# Patient Record
Sex: Male | Born: 1975 | Race: Black or African American | Hispanic: No | Marital: Married | Smoking: Never smoker
Health system: Southern US, Community
[De-identification: ages and names within clinical notes are randomized; demographics above are authoritative.]

## PROBLEM LIST (undated history)

## (undated) HISTORY — PX: VASECTOMY: SHX75

---

## 2020-05-17 ENCOUNTER — Encounter (HOSPITAL_BASED_OUTPATIENT_CLINIC_OR_DEPARTMENT_OTHER): Payer: Self-pay | Admitting: Emergency Medicine

## 2020-05-17 ENCOUNTER — Other Ambulatory Visit: Payer: Self-pay

## 2020-05-17 ENCOUNTER — Emergency Department (HOSPITAL_BASED_OUTPATIENT_CLINIC_OR_DEPARTMENT_OTHER): Payer: Managed Care, Other (non HMO)

## 2020-05-17 ENCOUNTER — Emergency Department (HOSPITAL_BASED_OUTPATIENT_CLINIC_OR_DEPARTMENT_OTHER)
Admission: EM | Admit: 2020-05-17 | Discharge: 2020-05-17 | Disposition: A | Discharge status: Home / Self Care | Payer: Managed Care, Other (non HMO) | Attending: Emergency Medicine | Admitting: Emergency Medicine

## 2020-05-17 DIAGNOSIS — R030 Elevated blood-pressure reading, without diagnosis of hypertension: Secondary | ICD-10-CM | POA: Diagnosis not present

## 2020-05-17 DIAGNOSIS — H53149 Visual discomfort, unspecified: Secondary | ICD-10-CM | POA: Insufficient documentation

## 2020-05-17 DIAGNOSIS — R519 Headache, unspecified: Secondary | ICD-10-CM | POA: Diagnosis present

## 2020-05-17 DIAGNOSIS — R202 Paresthesia of skin: Secondary | ICD-10-CM

## 2020-05-17 LAB — CBC WITH DIFFERENTIAL/PLATELET
Abs Immature Granulocytes: 0.01 10*3/uL (ref 0.00–0.07)
Basophils Absolute: 0 10*3/uL (ref 0.0–0.1)
Basophils Relative: 1 %
Eosinophils Absolute: 0.1 10*3/uL (ref 0.0–0.5)
Eosinophils Relative: 2 %
HCT: 45.9 % (ref 39.0–52.0)
Hemoglobin: 15.9 g/dL (ref 13.0–17.0)
Immature Granulocytes: 0 %
Lymphocytes Relative: 38 %
Lymphs Abs: 1.5 10*3/uL (ref 0.7–4.0)
MCH: 32.2 pg (ref 26.0–34.0)
MCHC: 34.6 g/dL (ref 30.0–36.0)
MCV: 92.9 fL (ref 80.0–100.0)
Monocytes Absolute: 0.4 10*3/uL (ref 0.1–1.0)
Monocytes Relative: 10 %
Neutro Abs: 2 10*3/uL (ref 1.7–7.7)
Neutrophils Relative %: 49 %
Platelets: 216 10*3/uL (ref 150–400)
RBC: 4.94 MIL/uL (ref 4.22–5.81)
RDW: 11.9 % (ref 11.5–15.5)
WBC: 4 10*3/uL (ref 4.0–10.5)
nRBC: 0 % (ref 0.0–0.2)

## 2020-05-17 LAB — COMPREHENSIVE METABOLIC PANEL
ALT: 23 U/L (ref 0–44)
AST: 27 U/L (ref 15–41)
Albumin: 4.3 g/dL (ref 3.5–5.0)
Alkaline Phosphatase: 47 U/L (ref 38–126)
Anion gap: 9 (ref 5–15)
BUN: 13 mg/dL (ref 6–20)
CO2: 25 mmol/L (ref 22–32)
Calcium: 9.1 mg/dL (ref 8.9–10.3)
Chloride: 104 mmol/L (ref 98–111)
Creatinine, Ser: 0.98 mg/dL (ref 0.61–1.24)
GFR calc Af Amer: >60 mL/min (ref >60)
GFR calc non Af Amer: >60 mL/min (ref >60)
Glucose, Bld: 119 mg/dL — ABNORMAL HIGH (ref 70–99)
Potassium: 3.4 mmol/L — ABNORMAL LOW (ref 3.5–5.1)
Sodium: 138 mmol/L (ref 135–145)
Total Bilirubin: 0.6 mg/dL (ref 0.3–1.2)
Total Protein: 7.8 g/dL (ref 6.5–8.1)

## 2020-05-17 MED ORDER — HYDROCHLOROTHIAZIDE 25 MG PO TABS: 25.0000 mg | ORAL_TABLET | Freq: Every day | ORAL | 0 refills | Status: AC

## 2020-05-17 MED ORDER — POTASSIUM CHLORIDE CRYS ER 20 MEQ PO TBCR
40.0000 meq | EXTENDED_RELEASE_TABLET | Freq: Once | ORAL | Status: AC
  Administered 2020-05-17: 40 meq via ORAL
  Filled 2020-05-17: qty 2

## 2020-05-17 MED ORDER — HYDROCHLOROTHIAZIDE 25 MG PO TABS
25.0000 mg | ORAL_TABLET | Freq: Once | ORAL | Status: AC
  Administered 2020-05-17: 25 mg via ORAL
  Filled 2020-05-17: qty 1

## 2020-05-17 NOTE — ED Provider Notes (Signed)
[facility] EMERGENCY DEPARTMENT Provider Note   CSN: [id number] Arrival date & time: 05/17/20  [id number]     History Chief Complaint  Patient presents with  . Numbness    Mitchell IvanBrian Mahajan is a 44 y.o. male.  The history is provided by medical records and the patient. No language interpreter was used.  Hypertension This is a new problem. The current episode started more than 1 week ago. The problem occurs constantly. The problem has not changed since onset.Associated symptoms include headaches. Pertinent negatives include no chest pain, no abdominal pain and no shortness of breath. Nothing aggravates the symptoms. Nothing relieves the symptoms. He has tried nothing for the symptoms. The treatment provided no relief.       History reviewed. No pertinent past medical history.  There are no problems to display for this patient.   Past Surgical History:  Procedure Laterality Date  . VASECTOMY         History reviewed. No pertinent family history.  Social History   Tobacco Use  . Smoking status: Never Smoker  . Smokeless tobacco: Never Used  Substance Use Topics  . Alcohol use: Yes  . Drug use: Never    Home Medications Prior to Admission medications   Not on File    Allergies    Patient has no known allergies.  Review of Systems   Review of Systems  Constitutional: Negative for chills, diaphoresis, fatigue and fever.  HENT: Negative for congestion.   Eyes: Positive for photophobia. Negative for visual disturbance.  Respiratory: Negative for cough, chest tightness, shortness of breath and wheezing.   Cardiovascular: Negative for chest pain and palpitations.  Gastrointestinal: Negative for abdominal pain, constipation, diarrhea, nausea and vomiting.  Genitourinary: Negative for dysuria, flank pain and frequency.  Musculoskeletal: Negative for back pain, neck pain and neck stiffness.  Neurological: Positive for numbness (tingling in hands and feet) and  headaches. Negative for dizziness, syncope, weakness and light-headedness.  Psychiatric/Behavioral: Negative for agitation and confusion.  All other systems reviewed and are negative.   Physical Exam Updated Vital Signs BP (!) 153/109 (BP Location: Right Arm)   Pulse (!) 56   Temp 98.5 F (36.9 C) (Oral)   Resp 15   Wt 83.9 kg   SpO2 100%   Physical Exam Vitals and nursing note reviewed.  Constitutional:      General: He is not in acute distress.    Appearance: He is well-developed. He is not ill-appearing, toxic-appearing or diaphoretic.  HENT:     Head: Normocephalic and atraumatic.     Nose: Nose normal. No congestion or rhinorrhea.     Mouth/Throat:     Mouth: Mucous membranes are moist.     Pharynx: No posterior oropharyngeal erythema.  Eyes:     Extraocular Movements: Extraocular movements intact.     Conjunctiva/sclera: Conjunctivae normal.     Pupils: Pupils are equal, round, and reactive to light.  Cardiovascular:     Rate and Rhythm: Normal rate and regular rhythm.     Heart sounds: No murmur heard.   Pulmonary:     Effort: Pulmonary effort is normal. No respiratory distress.     Breath sounds: Normal breath sounds. No wheezing, rhonchi or rales.  Chest:     Chest wall: No tenderness.  Abdominal:     General: Abdomen is flat.     Palpations: Abdomen is soft.     Tenderness: There is no abdominal tenderness. There is no right CVA tenderness, left CVA  tenderness, guarding or rebound.  Musculoskeletal:        General: No tenderness.     Cervical back: Neck supple. No tenderness.     Right lower leg: No edema.     Left lower leg: No edema.  Skin:    General: Skin is warm and dry.     Capillary Refill: Capillary refill takes less than 2 seconds.     Findings: No erythema.  Neurological:     General: No focal deficit present.     Mental Status: He is alert and oriented to person, place, and time.     GCS: GCS eye subscore is 4. GCS verbal subscore is 5. GCS  motor subscore is 6.     Cranial Nerves: No cranial nerve deficit, dysarthria or facial asymmetry.     Sensory: No sensory deficit.     Motor: No weakness, tremor, abnormal muscle tone or seizure activity.     Coordination: Coordination normal. Finger-Nose-Finger Test normal.     Comments: Unremarkable neurologic exam on my exam.  No numbness, tingling, grip strength decreased, or weakness present.  Good pulses in extremities.  Psychiatric:        Mood and Affect: Mood normal.     ED Results / Procedures / Treatments   Labs (all labs ordered are listed, but only abnormal results are displayed) Labs Reviewed  COMPREHENSIVE METABOLIC PANEL - Abnormal; Notable for the following components:      Result Value   Potassium 3.4 (*)    Glucose, Bld 119 (*)    All other components within normal limits  CBC WITH DIFFERENTIAL/PLATELET    EKG EKG Interpretation  Date/Time:  Saturday May 17 2020 09:38:31 EDT Ventricular Rate:  81 PR Interval:  188 QRS Duration: 90 QT Interval:  380 QTC Calculation: 441 R Axis:   -2 Text Interpretation: Sinus rhythm with Premature atrial complexes Nonspecific T wave abnormality Abnormal ECG No previous ECGs available Confirmed by Alvira Monday ([id number]) on 05/17/2020 12:38:35 PM   Radiology CT Head Wo Contrast  Result Date: 05/17/2020 CLINICAL DATA:  Headache.  Elevated blood pressure. EXAM: CT HEAD WITHOUT CONTRAST TECHNIQUE: Contiguous axial images were obtained from the base of the skull through the vertex without intravenous contrast. COMPARISON:  None. FINDINGS: Brain: No mass lesion, hemorrhage, hydrocephalus, acute infarct, intra-axial, or extra-axial fluid collection. Vascular: No hyperdense vessel or unexpected calcification. Skull: Normal skull. Sinuses/Orbits: Normal imaged portions of the orbits and globes. Clear paranasal sinuses and mastoid air cells. Other: None. IMPRESSION: Normal head CT. Electronically Signed   By: Jeronimo Greaves M.D.    On: 05/17/2020 11:01    Procedures Procedures (including critical care time)  Medications Ordered in ED Medications  potassium chloride SA (KLOR-CON) CR tablet 40 mEq (has no administration in time range)  hydrochlorothiazide (HYDRODIURIL) tablet 25 mg (has no administration in time range)    ED Course  I have reviewed the triage vital signs and the nursing notes.  Pertinent labs & imaging results that were available during my care of the patient were reviewed by me and considered in my medical decision making (see chart for details).    MDM Rules/Calculators/A&P                          Mitchell Lee is a 44 y.o. male with no significant past medical history aside from recent vasectomy who presents with low blood pressures, some headache, and extremity tingling.  Patient reports that  he checked his blood pressure at a preoperative visit for vasectomy that he recently had.  He then checked his blood pressure again and it was still elevated in the 150s and 160s.  He reports that one was in the 170s today, he wanted to get evaluated because he was having some moderate headaches with some tingling in his extremities.  He reports the tingling and numbness sensation was on both his hands and his feet but waxes and wanes with his headache.  He denies any vision changes, nausea, or vomiting.  Denies any neck pain or neck injury.  Denies any head injury.  He reports he does have photophobia.  He denies any fevers, chills, cough, chest pain, shortness of breath, lightheadedness, fatigue, urinary symptoms, or GI symptoms.  He has no history of elevated blood pressures.  He does not take any medications.  On exam, lungs clear chest nontender.  Abdomen nontender.  No focal neurologic deficits on my initial exam.  Patient resting comfortably without any fever or tachycardia.  His oxygen saturations are normal on room air.  His blood pressure is elevated and was in the 160s on my evaluation.  Due to  headache, elevated blood pressure, and some tingling, patient had a head CT in triage that was completely normal.  Other labs showed normal kidney function and liver function.  No leukocytosis or anemia.  Very mild hypokalemia seen.  Patient reports his headache is improved to a 2 out of 10 down from a 5 or 6 out of 10 on arrival.  He reports the tingling sensation is also resolved.  Patient is concerned about his elevated blood pressure that he is now had for the last month.  He reports he bought a blood pressure cuff and it was elevated in the 170s before coming.  We discussed initiation of a blood pressure medication such as HCTZ as well as supplementing his potassium that was barely low today.  Patient agrees that he wants to start blood pressure medicine so we will start him on HCTZ and have him follow-up with his PCP this week to discuss continuation or cessation of medication.  Patient understands extremely strict return precautions.  Patient agreed with plan of care and was discharged in good condition.   Final Clinical Impression(s) / ED Diagnoses Final diagnoses:  Blood pressure elevated without history of HTN  Tingling in extremities  Acute nonintractable headache, unspecified headache type    Rx / DC Orders ED Discharge Orders         Ordered    hydrochlorothiazide (HYDRODIURIL) 25 MG tablet  Daily        05/17/20 1548          Clinical Impression: 1. Blood pressure elevated without history of HTN   2. Tingling in extremities   3. Acute nonintractable headache, unspecified headache type     Disposition: Discharge  Condition: Good  I have discussed the results, Dx and Tx plan with the pt(& family if present). He/she/they expressed understanding and agree(s) with the plan. Discharge instructions discussed at great length. Strict return precautions discussed and pt &/or family have verbalized understanding of the instructions. No further questions at time of discharge.     New Prescriptions   HYDROCHLOROTHIAZIDE (HYDRODIURIL) 25 MG TABLET    Take 1 tablet (25 mg total) by mouth daily.    Follow Up: Verlon Au, MD [address] [place] [address] [phone]     [facility]  EMERGENCY DEPARTMENT [id number] [facility] [place] [address] [phone]       Ashunti Schofield, Canary Brim, MD 05/17/20 [phone]

## 2020-05-17 NOTE — ED Notes (Signed)
Pt ambulatory to room, no s/s of distress, ambulatory with normal gait

## 2020-05-17 NOTE — ED Notes (Signed)
Pt discharged to home. Discharge instructions have been discussed with patient and/or family members. Pt verbally acknowledges understanding d/c instructions, and endorses comprehension to checkout at registration before leaving.  °

## 2020-05-17 NOTE — Discharge Instructions (Signed)
Your work-up today was overall reassuring but he did have persistent elevated blood pressures as we discussed.  As you report you had elevated blood pressures for several weeks with multiple readings and no history of hypertension, we will start you on a low-dose blood pressure medication.  Please call your primary doctor this week to discuss either continuation or cessation of the medication in the future.  Your CT head did not show any bleeding, stroke, mass, or other concerning findings and your blood work otherwise looked reassuring as well.  Please rest and stay hydrated.  If any symptoms change or worsen, please return to the nearest emergency department immediately.

## 2020-05-17 NOTE — ED Triage Notes (Signed)
Patient states that he has had High BP for the last month. 2 days ago he started to have numbness to both feet and hands as well as a headache.

## 2022-03-05 IMAGING — CT CT HEAD W/O CM
3 series · 16 of 47 positions shown, 19 images · non-contrast
Comparison: None.

CLINICAL DATA: Headache.  Elevated blood pressure.

EXAM:
CT HEAD WITHOUT CONTRAST
TECHNIQUE: Contiguous axial images were obtained from the base of the skull
through the vertex without intravenous contrast.

[Series 2: head wo · axial · 0.49mm/px · z∈[+1115,+1245]mm · 10 of 32 slices shown, 13 images]
[im 3/32  brain]
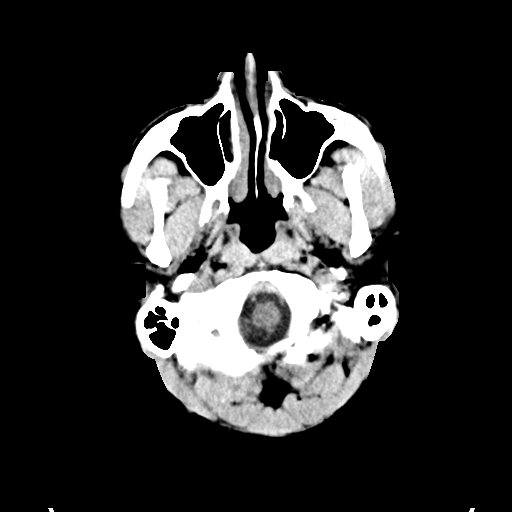
[im 3/32  bone]
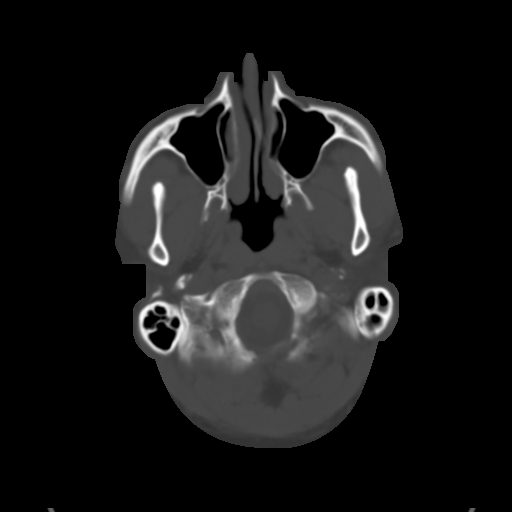
[im 6/32  brain]
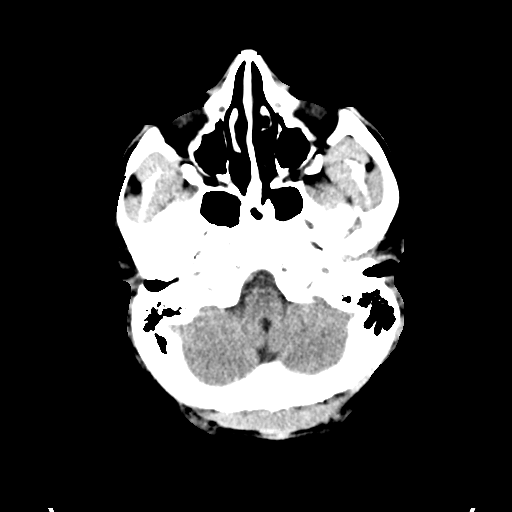
[im 9/32  brain]
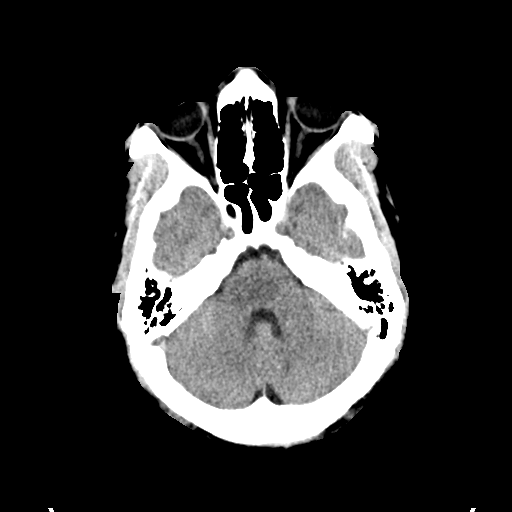
[im 11/32  brain]
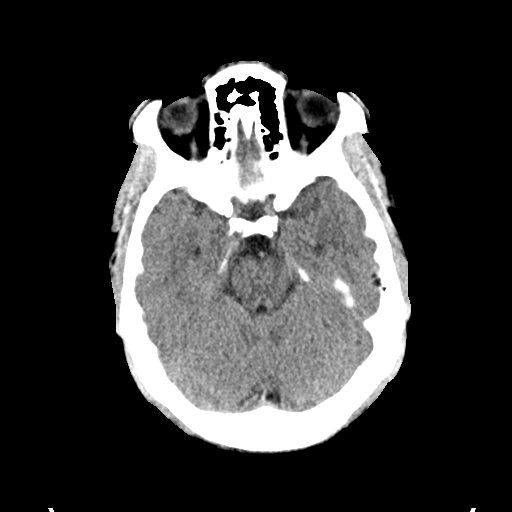
[im 14/32  brain]
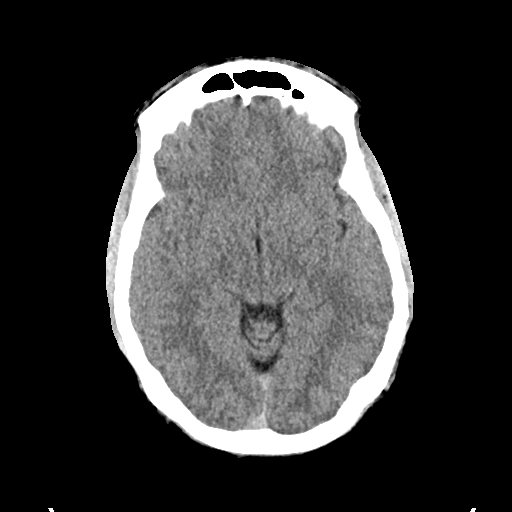
[im 14/32  bone]
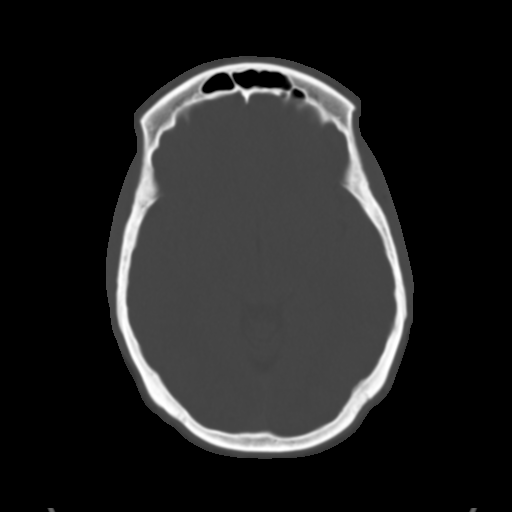
[im 18/32  brain]
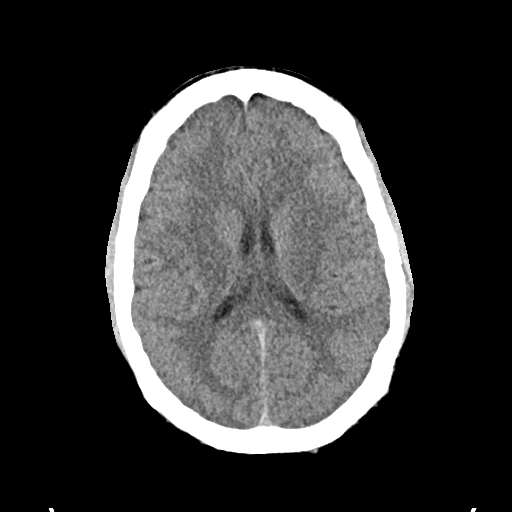
[im 21/32  brain]
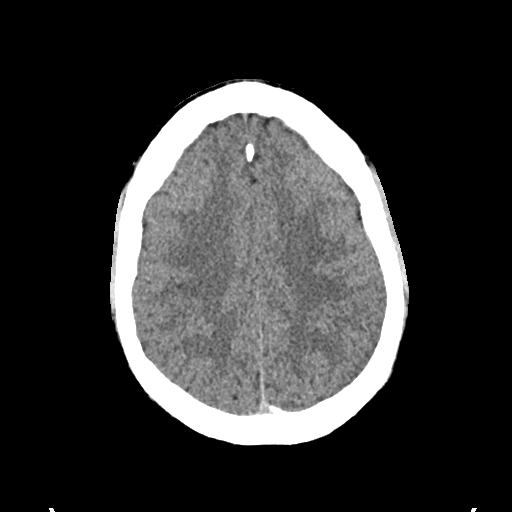
[im 24/32  brain]
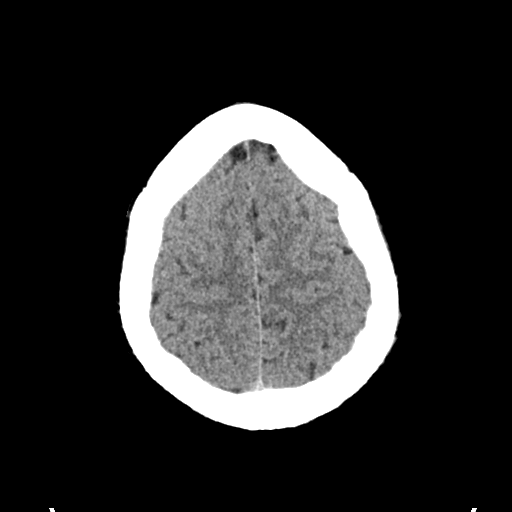
[im 26/32  brain]
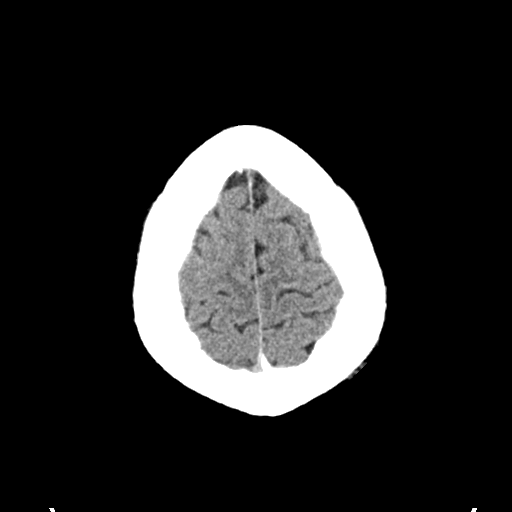
[im 26/32  bone]
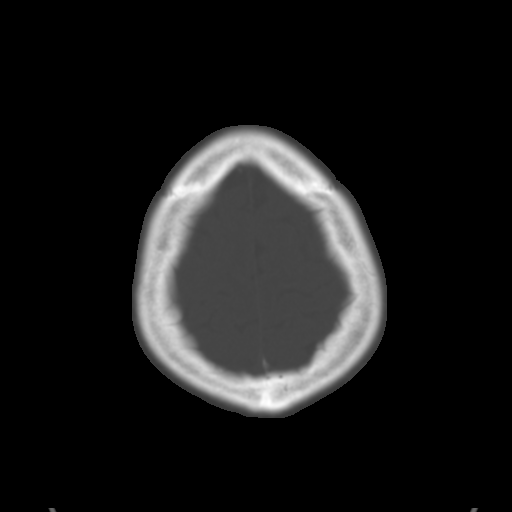
[im 29/32  brain]
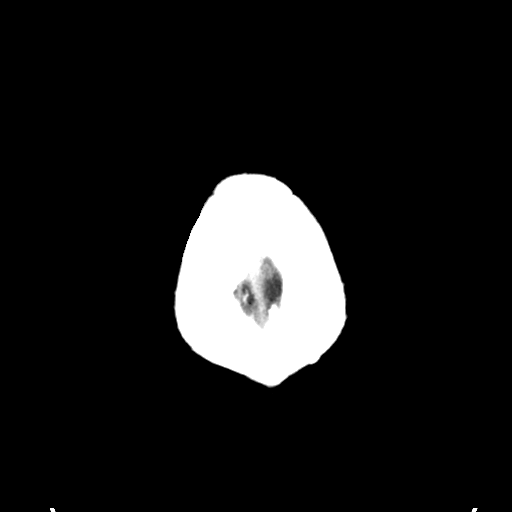

[Series 4: coronal soft · coronal · 0.36mm/px · 3 of 71 slices shown]
[im 24/71  brain]
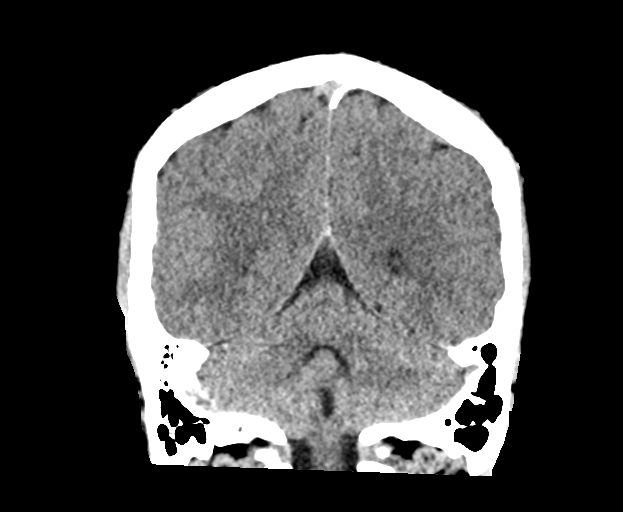
[im 32/71  brain]
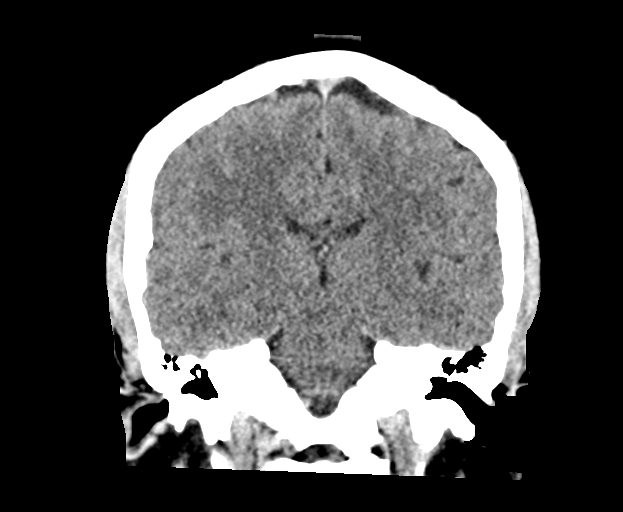
[im 39/71  brain]
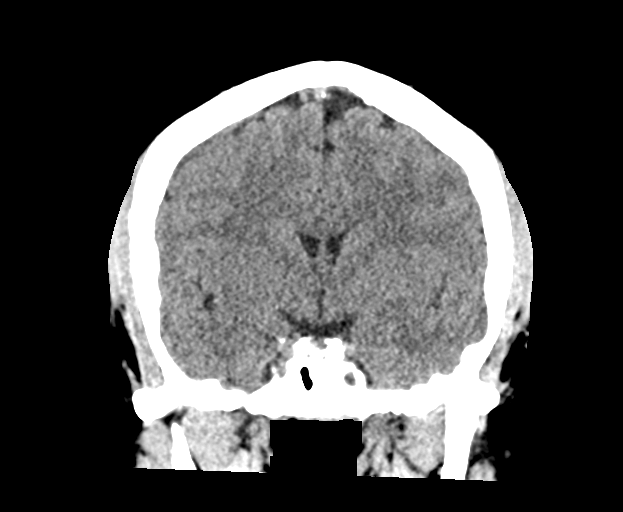

[Series 5: sag soft · sagittal · 0.36mm/px · 3 of 57 slices shown]
[im 19/57  brain]
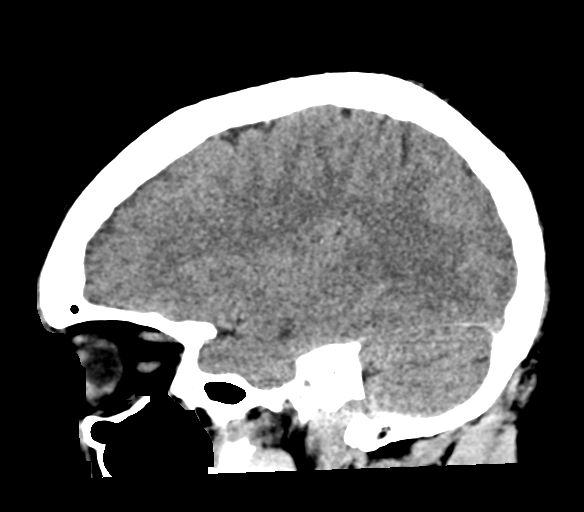
[im 29/57  brain]
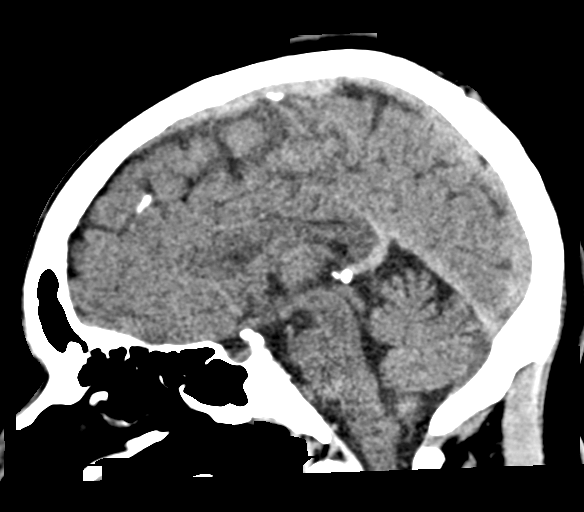
[im 38/57  brain]
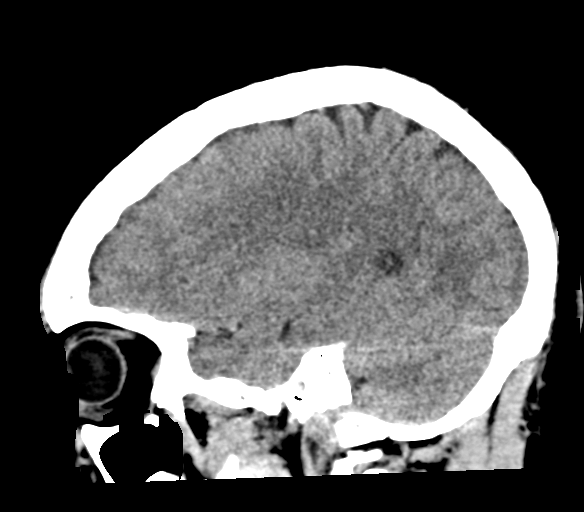

[16 of 47 positions shown; findings below may reference images not displayed]

FINDINGS: Brain: No mass lesion, hemorrhage, hydrocephalus, acute infarct,
intra-axial, or extra-axial fluid collection.

Vascular: No hyperdense vessel or unexpected calcification.

Skull: Normal skull.

Sinuses/Orbits: Normal imaged portions of the orbits and globes.
Clear paranasal sinuses and mastoid air cells.

Other: None.
IMPRESSION: Normal head CT.
